# Patient Record
Sex: Male | Born: 1965 | Race: White | Hispanic: No | Smoking: Current every day smoker
Health system: Southern US, Community
[De-identification: ages and names within clinical notes are randomized; demographics above are authoritative.]

---

## 2001-01-08 ENCOUNTER — Encounter: Admission: RE | Admit: 2001-01-08 | Discharge: 2001-01-28 | Payer: Self-pay | Admitting: Family Medicine

## 2007-09-06 ENCOUNTER — Emergency Department (HOSPITAL_COMMUNITY): Admission: EM | Admit: 2007-09-06 | Discharge: 2007-09-06 | Payer: Self-pay | Admitting: Emergency Medicine

## 2008-06-19 ENCOUNTER — Emergency Department (HOSPITAL_COMMUNITY): Admission: EM | Admit: 2008-06-19 | Discharge: 2008-06-19 | Payer: Self-pay | Admitting: Emergency Medicine

## 2008-06-20 ENCOUNTER — Ambulatory Visit (HOSPITAL_COMMUNITY): Admission: RE | Admit: 2008-06-20 | Discharge: 2008-06-20 | Payer: Self-pay | Admitting: Family Medicine

## 2008-06-22 ENCOUNTER — Ambulatory Visit (HOSPITAL_COMMUNITY): Admission: RE | Admit: 2008-06-22 | Discharge: 2008-06-22 | Payer: Self-pay | Admitting: Family Medicine

## 2008-08-12 ENCOUNTER — Ambulatory Visit (HOSPITAL_COMMUNITY): Payer: Self-pay | Admitting: Oncology

## 2008-08-12 ENCOUNTER — Encounter (HOSPITAL_COMMUNITY): Admission: RE | Admit: 2008-08-12 | Discharge: 2008-09-11 | Payer: Self-pay | Admitting: Oncology

## 2008-10-24 ENCOUNTER — Ambulatory Visit (HOSPITAL_COMMUNITY): Admission: RE | Admit: 2008-10-24 | Discharge: 2008-10-24 | Payer: Self-pay | Admitting: Family Medicine

## 2009-02-28 ENCOUNTER — Emergency Department (HOSPITAL_COMMUNITY): Admission: EM | Admit: 2009-02-28 | Discharge: 2009-03-01 | Payer: Self-pay | Admitting: Emergency Medicine

## 2009-05-04 ENCOUNTER — Emergency Department (HOSPITAL_COMMUNITY): Admission: EM | Admit: 2009-05-04 | Discharge: 2009-05-04 | Payer: Self-pay | Admitting: Emergency Medicine

## 2009-05-24 ENCOUNTER — Emergency Department (HOSPITAL_COMMUNITY): Admission: EM | Admit: 2009-05-24 | Discharge: 2009-05-24 | Payer: Self-pay | Admitting: Emergency Medicine

## 2009-07-19 ENCOUNTER — Emergency Department (HOSPITAL_COMMUNITY): Admission: EM | Admit: 2009-07-19 | Discharge: 2009-07-19 | Payer: Self-pay | Admitting: Emergency Medicine

## 2009-07-21 ENCOUNTER — Emergency Department (HOSPITAL_COMMUNITY): Admission: EM | Admit: 2009-07-21 | Discharge: 2009-07-21 | Payer: Self-pay | Admitting: Emergency Medicine

## 2009-08-01 ENCOUNTER — Emergency Department (HOSPITAL_COMMUNITY): Admission: EM | Admit: 2009-08-01 | Discharge: 2009-08-01 | Payer: Self-pay | Admitting: Emergency Medicine

## 2010-08-03 ENCOUNTER — Emergency Department (HOSPITAL_COMMUNITY)
Admission: EM | Admit: 2010-08-03 | Discharge: 2010-08-03 | Payer: Self-pay | Source: Home / Self Care | Admitting: Emergency Medicine

## 2010-08-26 ENCOUNTER — Encounter: Payer: Self-pay | Admitting: Neurosurgery

## 2010-09-05 ENCOUNTER — Emergency Department (HOSPITAL_COMMUNITY)
Admission: EM | Admit: 2010-09-05 | Discharge: 2010-09-05 | Disposition: A | Discharge status: Home / Self Care | Payer: Self-pay | Attending: Emergency Medicine | Admitting: Emergency Medicine

## 2010-09-05 DIAGNOSIS — K029 Dental caries, unspecified: Secondary | ICD-10-CM | POA: Insufficient documentation

## 2010-09-05 DIAGNOSIS — K089 Disorder of teeth and supporting structures, unspecified: Secondary | ICD-10-CM | POA: Insufficient documentation

## 2010-10-11 ENCOUNTER — Emergency Department (HOSPITAL_COMMUNITY): Payer: Self-pay

## 2010-10-11 ENCOUNTER — Emergency Department (HOSPITAL_COMMUNITY)
Admission: EM | Admit: 2010-10-11 | Discharge: 2010-10-11 | Disposition: A | Discharge status: Home / Self Care | Payer: Self-pay | Attending: Emergency Medicine | Admitting: Emergency Medicine

## 2010-10-11 ENCOUNTER — Encounter (HOSPITAL_COMMUNITY): Payer: Self-pay | Admitting: Radiology

## 2010-10-11 DIAGNOSIS — R51 Headache: Secondary | ICD-10-CM | POA: Insufficient documentation

## 2010-10-11 DIAGNOSIS — J029 Acute pharyngitis, unspecified: Secondary | ICD-10-CM | POA: Insufficient documentation

## 2010-10-11 LAB — BASIC METABOLIC PANEL
Calcium: 9.9 mg/dL (ref 8.4–10.5)
Chloride: 104 mEq/L (ref 96–112)
GFR calc Af Amer: >60 mL/min (ref >60)
GFR calc non Af Amer: >60 mL/min (ref >60)
Potassium: 4.1 mEq/L (ref 3.5–5.1)

## 2010-10-11 LAB — RAPID STREP SCREEN (MED CTR MEBANE ONLY): Streptococcus, Group A Screen (Direct): NEGATIVE

## 2010-10-11 MED ORDER — IOHEXOL 300 MG/ML  SOLN
75.0000 mL | Freq: Once | INTRAMUSCULAR | Status: AC | PRN
  Administered 2010-10-11: 75 mL via INTRAVENOUS

## 2010-11-19 LAB — CBC
HCT: 49.3 % (ref 39.0–52.0)
Hemoglobin: 16.8 g/dL (ref 13.0–17.0)
Platelets: 222 10*3/uL (ref 150–400)
RBC: 4.62 MIL/uL (ref 4.22–5.81)
WBC: 6.2 10*3/uL (ref 4.0–10.5)

## 2010-11-19 LAB — COMPREHENSIVE METABOLIC PANEL
Albumin: 4.4 g/dL (ref 3.5–5.2)
CO2: 31 mEq/L (ref 19–32)
Calcium: 9.7 mg/dL (ref 8.4–10.5)
GFR calc non Af Amer: >60 mL/min (ref >60)
Glucose, Bld: 118 mg/dL — ABNORMAL HIGH (ref 70–99)
Total Protein: 7.3 g/dL (ref 6.0–8.3)

## 2010-11-19 LAB — DIFFERENTIAL
Eosinophils Absolute: 0.1 10*3/uL (ref 0.0–0.7)
Eosinophils Relative: 2 % (ref 0–5)
Lymphocytes Relative: 40 % (ref 12–46)
Lymphs Abs: 2.5 10*3/uL (ref 0.7–4.0)
Monocytes Relative: 7 % (ref 3–12)
Neutrophils Relative %: 51 % (ref 43–77)

## 2010-11-19 LAB — RETICULOCYTES
RBC.: 4.63 MIL/uL (ref 4.22–5.81)
Retic Count, Absolute: 37 10*3/uL (ref 19.0–186.0)
Retic Ct Pct: 0.8 % (ref 0.4–3.1)

## 2010-11-19 LAB — FOLATE: Folate: >20 ng/mL

## 2010-11-19 LAB — VITAMIN B12: Vitamin B-12: 678 pg/mL (ref 211–911)

## 2010-11-19 LAB — LIPID PANEL
Cholesterol: 229 mg/dL — ABNORMAL HIGH (ref 0–200)
LDL Cholesterol: 138 mg/dL — ABNORMAL HIGH (ref 0–99)
Triglycerides: 326 mg/dL — ABNORMAL HIGH (ref <150)

## 2010-12-23 ENCOUNTER — Emergency Department (HOSPITAL_COMMUNITY)
Admission: EM | Admit: 2010-12-23 | Discharge: 2010-12-23 | Disposition: A | Discharge status: Home / Self Care | Payer: Self-pay | Attending: Emergency Medicine | Admitting: Emergency Medicine

## 2010-12-23 DIAGNOSIS — K029 Dental caries, unspecified: Secondary | ICD-10-CM | POA: Insufficient documentation

## 2010-12-23 DIAGNOSIS — K089 Disorder of teeth and supporting structures, unspecified: Secondary | ICD-10-CM | POA: Insufficient documentation

## 2011-04-09 ENCOUNTER — Other Ambulatory Visit (HOSPITAL_COMMUNITY): Payer: Self-pay | Admitting: Family Medicine

## 2011-04-09 DIAGNOSIS — R200 Anesthesia of skin: Secondary | ICD-10-CM

## 2011-04-09 DIAGNOSIS — M549 Dorsalgia, unspecified: Secondary | ICD-10-CM

## 2011-04-15 ENCOUNTER — Inpatient Hospital Stay (HOSPITAL_COMMUNITY): Admission: RE | Admit: 2011-04-15 | Payer: Self-pay | Source: Ambulatory Visit

## 2011-04-24 ENCOUNTER — Other Ambulatory Visit (HOSPITAL_COMMUNITY): Payer: Self-pay | Admitting: Emergency Medicine

## 2011-04-24 DIAGNOSIS — M48061 Spinal stenosis, lumbar region without neurogenic claudication: Secondary | ICD-10-CM

## 2011-04-29 ENCOUNTER — Ambulatory Visit (HOSPITAL_COMMUNITY)
Admission: RE | Admit: 2011-04-29 | Discharge: 2011-04-29 | Disposition: A | Discharge status: Home / Self Care | Payer: BC Managed Care – PPO | Source: Ambulatory Visit | Attending: Orthopedic Surgery | Admitting: Orthopedic Surgery

## 2011-04-29 DIAGNOSIS — M5126 Other intervertebral disc displacement, lumbar region: Secondary | ICD-10-CM | POA: Insufficient documentation

## 2011-04-29 DIAGNOSIS — M48061 Spinal stenosis, lumbar region without neurogenic claudication: Secondary | ICD-10-CM

## 2011-04-29 DIAGNOSIS — M545 Low back pain, unspecified: Secondary | ICD-10-CM | POA: Insufficient documentation

## 2011-09-07 ENCOUNTER — Emergency Department (HOSPITAL_COMMUNITY)
Admission: EM | Admit: 2011-09-07 | Discharge: 2011-09-07 | Disposition: A | Discharge status: Home / Self Care | Payer: BC Managed Care – PPO | Attending: Emergency Medicine | Admitting: Emergency Medicine

## 2011-09-07 ENCOUNTER — Encounter (HOSPITAL_COMMUNITY): Payer: Self-pay | Admitting: *Deleted

## 2011-09-07 DIAGNOSIS — L723 Sebaceous cyst: Secondary | ICD-10-CM

## 2011-09-07 DIAGNOSIS — F17200 Nicotine dependence, unspecified, uncomplicated: Secondary | ICD-10-CM | POA: Insufficient documentation

## 2011-09-07 MED ORDER — HYDROCODONE-ACETAMINOPHEN 5-325 MG PO TABS: ORAL_TABLET | ORAL | Status: AC

## 2011-09-07 MED ORDER — OXYCODONE-ACETAMINOPHEN 5-325 MG PO TABS
1.0000 | ORAL_TABLET | Freq: Once | ORAL | Status: AC
  Administered 2011-09-07: 1 via ORAL
  Filled 2011-09-07: qty 1

## 2011-09-07 MED ORDER — DOXYCYCLINE HYCLATE 100 MG PO CAPS: 100.0000 mg | ORAL_CAPSULE | Freq: Two times a day (BID) | ORAL | Status: AC

## 2011-09-07 MED ORDER — LIDOCAINE HCL (PF) 1 % IJ SOLN
5.0000 mL | Freq: Once | INTRAMUSCULAR | Status: AC
  Administered 2011-09-07: 5 mL
  Filled 2011-09-07: qty 5

## 2011-09-07 NOTE — ED Notes (Signed)
Cyst to right ear. Has had same before and lanced but recently returned.

## 2011-09-07 NOTE — ED Provider Notes (Signed)
History     CSN: [id number]  Arrival date & time 09/07/11  1402   First MD Initiated Contact with Patient 09/07/11 1412      Chief Complaint  Patient presents with  . Abscess    (Consider location/radiation/quality/duration/timing/severity/associated sxs/prior treatment) Patient is a 46 y.o. male presenting with abscess. The history is provided by the patient. No language interpreter was used.  Abscess  This is a recurrent problem. The current episode started less than one week ago. The onset was gradual. The problem occurs occasionally. The problem has been gradually worsening. The abscess is present on the right ear. The problem is mild. The abscess is characterized by swelling and painfulness. It is unknown what he was exposed to. Pertinent negatives include no decrease in physical activity, no fever, no congestion, no rhinorrhea and no sore throat. His past medical history is significant for skin abscesses in family. There were no sick contacts. He has received no recent medical care.    History reviewed. No pertinent past medical history.  History reviewed. No pertinent past surgical history.  No family history on file.  History  Substance Use Topics  . Smoking status: Current Everyday Smoker  . Smokeless tobacco: Not on file  . Alcohol Use: No      Review of Systems  Constitutional: Negative for fever and chills.  HENT: Positive for ear pain. Negative for congestion, sore throat, rhinorrhea, trouble swallowing, neck pain and neck stiffness.   Respiratory: Negative for shortness of breath.   Cardiovascular: Negative for chest pain and palpitations.  Musculoskeletal: Negative for myalgias and arthralgias.  Skin: Negative for rash.       abscess  Neurological: Negative for dizziness, weakness and numbness.  Hematological: Does not bruise/bleed easily.  All other systems reviewed and are negative.    Allergies  Aspirin and Ibuprofen  Home Medications   Current  Outpatient Rx  Name Route Sig Dispense Refill  . DOXYCYCLINE HYCLATE 100 MG PO CAPS Oral Take 1 capsule (100 mg total) by mouth 2 (two) times daily. For 10 days 20 capsule 0  . HYDROCODONE-ACETAMINOPHEN 5-325 MG PO TABS  Take one-two tabs po q 4-6 hrs prn pain 20 tablet 0    BP 127/107  Pulse 80  Temp(Src) 98 F (36.7 C) (Oral)  Resp 20  Ht 5\' 11"  (1.803 m)  Wt 195 lb (88.451 kg)  BMI 27.20 kg/m2  SpO2 99%  Physical Exam  Nursing note and vitals reviewed. Constitutional: He is oriented to person, place, and time. He appears well-developed and well-nourished. No distress.  HENT:  Head: Normocephalic and atraumatic.  Right Ear: There is swelling and tenderness. No mastoid tenderness. Tympanic membrane is not erythematous. No hemotympanum.  Left Ear: Tympanic membrane, external ear and ear canal normal.  Ears:  Mouth/Throat: Uvula is midline, oropharynx is clear and moist and mucous membranes are normal.       Abscess to right earlobe  Neck: Normal range of motion. Neck supple.  Cardiovascular: Normal rate, regular rhythm and normal heart sounds.   Pulmonary/Chest: Effort normal and breath sounds normal. No respiratory distress.  Musculoskeletal: Normal range of motion. He exhibits no tenderness.  Lymphadenopathy:    He has no cervical adenopathy.  Neurological: He is alert and oriented to person, place, and time. No cranial nerve deficit. He exhibits normal muscle tone. Coordination normal.  Skin: Skin is warm and dry.    ED Course  Procedures (including critical care time) INCISION AND DRAINAGE Performed by: Pauline Aus  L. Consent: Verbal consent obtained. Risks and benefits: risks, benefits and alternatives were discussed Type: abscess  Body area: base of right earlobe  Anesthesia: local infiltration  Local anesthetic: lidocaine 1% w/o epinephrine  Anesthetic total: 1 ml  Complexity: complex Blunt dissection to break up loculations  Drainage:  purulent  Drainage amount: moderate  Packing material: 1/4 in iodoform gauze  Patient tolerance: Patient tolerated the procedure well with no immediate complications.     1. Sebaceous cyst       MDM   Pt with hx of recurrent sebaceous cyst.  Pt agrees to f/u with a dermatologist.  Pain improved after ed course.   Patient / Family / Caregiver understand and agree with initial ED impression and plan with expectations set for ED visit.     Mirranda Monrroy L. [place], [place] 09/09/11 2042

## 2011-09-07 NOTE — ED Notes (Signed)
Pt a/ox4. Resp even and unlabored. NAD at this time. D/C instructions reviewed with pt. Pt verbalized understanding. Pt ambulated to lobby with steady gate.  

## 2011-09-07 NOTE — ED Notes (Signed)
Pt presented to the ED with pain in the right ear. Pt states that he noticed pain in his ear yesterday and states this is not the 1st time he has had this problem. Pt notes he had the same issue a "couple of years ago". Pt states the pain is a 8 on scale of 0-10. Swelling and redness noted on the right ear with some pain during palpitation. Pt noted no pain in sinus region but indicated it feels as if he is having "tooth" pain. Pt resp normal and unlabored. Pt A&O X 4. Pt ambulated to the Ed.

## 2011-09-10 NOTE — ED Provider Notes (Signed)
Medical screening examination/treatment/procedure(s) were performed by non-physician practitioner and as supervising physician I was immediately available for consultation/collaboration. Devoria Albe, MD, FACEP   Ward Givens, MD 09/10/11 [phone]

## 2011-10-23 ENCOUNTER — Emergency Department (HOSPITAL_COMMUNITY)
Admission: EM | Admit: 2011-10-23 | Discharge: 2011-10-23 | Disposition: A | Discharge status: Home / Self Care | Payer: BC Managed Care – PPO | Attending: Emergency Medicine | Admitting: Emergency Medicine

## 2011-10-23 ENCOUNTER — Encounter (HOSPITAL_COMMUNITY): Payer: Self-pay

## 2011-10-23 DIAGNOSIS — J339 Nasal polyp, unspecified: Secondary | ICD-10-CM

## 2011-10-23 DIAGNOSIS — F17200 Nicotine dependence, unspecified, uncomplicated: Secondary | ICD-10-CM | POA: Insufficient documentation

## 2011-10-23 DIAGNOSIS — J029 Acute pharyngitis, unspecified: Secondary | ICD-10-CM | POA: Insufficient documentation

## 2011-10-23 DIAGNOSIS — J3489 Other specified disorders of nose and nasal sinuses: Secondary | ICD-10-CM | POA: Insufficient documentation

## 2011-10-23 NOTE — ED Notes (Signed)
Pt says was diagnosed with a cyst in his throat.  Says feels like cyst in behind his mouth inbetween his nose and mouth.  Says at times it flares up and hurts.  Pt denies difficulty breathing but says is painful.

## 2011-10-26 NOTE — ED Provider Notes (Signed)
History     CSN: [id number]  Arrival date & time 10/23/11  1245   First MD Initiated Contact with Patient 10/23/11 1600      Chief Complaint  Patient presents with  . Sore Throat    (Consider location/radiation/quality/duration/timing/severity/associated sxs/prior treatment) HPI Comments: Patient comes to the emergency department requesting a referral to ENT. He states that he has a cyst in his sinuses. States that he had any CT scan last year that confirmed this. He complains of intermittent pain to the soft palate area of his mouth. He also reports occasional nasal congestion. He denies difficulty swallowing, fever, or neck pain.  Patient is a 46 y.o. male presenting with pharyngitis. The history is provided by the patient. No language interpreter was used.  Sore Throat This is a chronic problem. The current episode started more than 1 month ago. The problem occurs constantly. The problem has been gradually worsening. Associated symptoms include congestion and a sore throat. Pertinent negatives include no coughing, fever, headaches, neck pain, swollen glands, vertigo or visual change. Associated symptoms comments: Nasal congestion. The symptoms are aggravated by nothing. He has tried nothing for the symptoms. The treatment provided no relief.    History reviewed. No pertinent past medical history.  History reviewed. No pertinent past surgical history.  No family history on file.  History  Substance Use Topics  . Smoking status: Current Everyday Smoker  . Smokeless tobacco: Not on file  . Alcohol Use: No      Review of Systems  Constitutional: Negative for fever and appetite change.  HENT: Positive for congestion and sore throat. Negative for facial swelling, rhinorrhea, trouble swallowing, neck pain, neck stiffness, dental problem, sinus pressure and ear discharge.   Respiratory: Negative for cough and shortness of breath.   Neurological: Negative for vertigo and headaches.   All other systems reviewed and are negative.    Allergies  Aspirin and Ibuprofen  Home Medications   Current Outpatient Rx  Name Route Sig Dispense Refill  . GUAIFENESIN 100 MG/5ML PO SYRP Oral Take 200 mg by mouth as needed. For cold symptoms      BP 141/97  Pulse 94  Temp(Src) 98.1 F (36.7 C) (Oral)  Resp 18  Ht 5\' 11"  (1.803 m)  Wt 200 lb (90.719 kg)  BMI 27.89 kg/m2  SpO2 97%  Physical Exam  Nursing note and vitals reviewed. Constitutional: He appears well-developed. No distress.  HENT:  Head: Normocephalic and atraumatic. No trismus in the jaw.  Right Ear: Tympanic membrane and ear canal normal.  Left Ear: Tympanic membrane and ear canal normal.  Nose: Mucosal edema present. No rhinorrhea or nasal septal hematoma. No epistaxis.  No foreign bodies. Right sinus exhibits no maxillary sinus tenderness and no frontal sinus tenderness. Left sinus exhibits no maxillary sinus tenderness and no frontal sinus tenderness.  Mouth/Throat: Uvula is midline, oropharynx is clear and moist and mucous membranes are normal. No oral lesions.  Eyes: EOM are normal. Pupils are equal, round, and reactive to light.  Cardiovascular: Normal rate and normal heart sounds.   Pulmonary/Chest: Breath sounds normal. No respiratory distress.  Neurological: He is alert. He exhibits normal muscle tone. Coordination normal.  Skin: Skin is warm and dry.    ED Course  Procedures (including critical care time)    1. Nasal polyp       MDM    Have reviewed patient's previous ED charts and nursing notes. Patient had a CT  maxillofacial March of 2012 that  showed a mucosal retention cyst of the right maxillary sinus.   Patient is alert vitals are stable, he is nontoxic appearing. Airway is patent. No edema.  I will refer the patient to ENT for further management.        Morrison Masser L. [place], [place] 10/26/11 1746

## 2011-10-27 NOTE — ED Provider Notes (Signed)
Medical screening examination/treatment/procedure(s) were performed by non-physician practitioner and as supervising physician I was immediately available for consultation/collaboration.  Flint Melter, MD 10/27/11 1126

## 2012-05-25 ENCOUNTER — Encounter (HOSPITAL_COMMUNITY): Payer: Self-pay | Admitting: *Deleted

## 2012-05-25 ENCOUNTER — Emergency Department (HOSPITAL_COMMUNITY)
Admission: EM | Admit: 2012-05-25 | Discharge: 2012-05-25 | Disposition: A | Discharge status: Home / Self Care | Payer: BC Managed Care – PPO | Attending: Emergency Medicine | Admitting: Emergency Medicine

## 2012-05-25 DIAGNOSIS — F17200 Nicotine dependence, unspecified, uncomplicated: Secondary | ICD-10-CM | POA: Insufficient documentation

## 2012-05-25 DIAGNOSIS — L089 Local infection of the skin and subcutaneous tissue, unspecified: Secondary | ICD-10-CM

## 2012-05-25 DIAGNOSIS — L723 Sebaceous cyst: Secondary | ICD-10-CM | POA: Insufficient documentation

## 2012-05-25 DIAGNOSIS — L729 Follicular cyst of the skin and subcutaneous tissue, unspecified: Secondary | ICD-10-CM

## 2012-05-25 MED ORDER — HYDROCODONE-ACETAMINOPHEN 7.5-325 MG PO TABS: 1.0000 | ORAL_TABLET | ORAL | Status: DC | PRN

## 2012-05-25 MED ORDER — AMOXICILLIN-POT CLAVULANATE 875-125 MG PO TABS: 1.0000 | ORAL_TABLET | Freq: Two times a day (BID) | ORAL | Status: DC

## 2012-05-25 NOTE — ED Provider Notes (Signed)
Medical screening examination/treatment/procedure(s) were performed by non-physician practitioner and as supervising physician I was immediately available for consultation/collaboration. Devoria Albe, MD, [facility]   Ward Givens, MD 05/25/12 1450

## 2012-05-25 NOTE — ED Notes (Signed)
H. Bryant, PA at bedside. 

## 2012-05-25 NOTE — ED Notes (Signed)
Pt with redness and swelling to right earlobe, c/o pain to area and to right jaw, reoccurred a month ago, hx of same one at same spot and was lanced per pt 2 years ago, states that it has a smell at times but denies any drainage, cyst is soft

## 2012-05-25 NOTE — ED Notes (Addendum)
Swelling, redness of rt ear. Says he  Had  A cyst removed from this ear 2 years ago "and it came back"

## 2012-05-25 NOTE — ED Provider Notes (Signed)
History     CSN: [id number]  Arrival date & time 05/25/12  1253   None     Chief Complaint  Patient presents with  . Abscess    (Consider location/radiation/quality/duration/timing/severity/associated sxs/prior treatment) Patient is a 46 y.o. male presenting with abscess. The history is provided by the patient.  Abscess  This is a new problem. The problem has been gradually worsening. The abscess is present on the right ear. The problem is moderate. The abscess is characterized by painfulness. It is unknown what he was exposed to. Pertinent negatives include no fever, no vomiting and no cough. His past medical history is significant for skin abscesses in family. There were no sick contacts. He has received no recent medical care.    History reviewed. No pertinent past medical history.  History reviewed. No pertinent past surgical history.  History reviewed. No pertinent family history.  History  Substance Use Topics  . Smoking status: Current Every Day Smoker  . Smokeless tobacco: Not on file  . Alcohol Use: No      Review of Systems  Constitutional: Negative for fever and activity change.       All ROS Neg except as noted in HPI  HENT: Negative for nosebleeds and neck pain.   Eyes: Negative for photophobia and discharge.  Respiratory: Negative for cough, shortness of breath and wheezing.   Cardiovascular: Negative for chest pain and palpitations.  Gastrointestinal: Negative for vomiting, abdominal pain and blood in stool.  Genitourinary: Negative for dysuria, frequency and hematuria.  Musculoskeletal: Negative for back pain and arthralgias.  Skin: Negative.   Neurological: Negative for dizziness, seizures and speech difficulty.  Psychiatric/Behavioral: Negative for hallucinations and confusion.    Allergies  Aspirin and Ibuprofen  Home Medications   Current Outpatient Rx  Name Route Sig Dispense Refill  . GUAIFENESIN 100 MG/5ML PO SYRP Oral Take 200 mg by  mouth as needed. For cold symptoms      BP 135/93  Pulse 97  Temp 98.1 F (36.7 C) (Oral)  Resp 20  Ht 5\' 11"  (1.803 m)  Wt 190 lb (86.183 kg)  BMI 26.50 kg/m2  SpO2 98%  Physical Exam  Nursing note and vitals reviewed. Constitutional: He is oriented to person, place, and time. He appears well-developed and well-nourished.  Non-toxic appearance.  HENT:  Head: Normocephalic.  Right Ear: Tympanic membrane and external ear normal.  Left Ear: Tympanic membrane and external ear normal.       The pain in the remainder of the external ear is somewhat red, but not hot. There is a soft tissue cyst at the base of the earlobe. This is tender to palpation. It is not hot. And there no red streaks noted. Is there is no pain or swelling or redness behind the ear.  Eyes: EOM and lids are normal. Pupils are equal, round, and reactive to light.  Neck: Normal range of motion. Neck supple. Carotid bruit is not present.  Cardiovascular: Normal rate, regular rhythm, normal heart sounds, intact distal pulses and normal pulses.   Pulmonary/Chest: Breath sounds normal. No respiratory distress.  Abdominal: Soft. Bowel sounds are normal. There is no tenderness. There is no guarding.  Musculoskeletal: Normal range of motion.  Lymphadenopathy:       Head (right side): No submandibular adenopathy present.       Head (left side): No submandibular adenopathy present.    He has no cervical adenopathy.  Neurological: He is alert and oriented to person, place, and time.  He has normal strength. No cranial nerve deficit or sensory deficit.  Skin: Skin is warm and dry.  Psychiatric: He has a normal mood and affect. His speech is normal.    ED Course  Procedures (including critical care time)  Labs Reviewed - No data to display No results found.   No diagnosis found.    MDM  I have reviewed nursing notes, vital signs, and all appropriate lab and imaging results for this patient. Patient has a fatty cyst at  the base of the right earlobe. Patient has had an incision and drainage in the past at which time was found this was a sebaceous cyst. The patient states that he got better and it has now come back again. The plan at this time is for the patient to see the general surgeon for surgical intervention. We'll cover the patient with antibiotic therapy including Augmentin 1 tablet 2 times daily. Norco every 4 hours as needed for pain.       Kathie Dike, [place] 05/25/12 1322

## 2021-03-18 ENCOUNTER — Encounter (HOSPITAL_COMMUNITY): Payer: Self-pay

## 2021-03-18 ENCOUNTER — Other Ambulatory Visit: Payer: Self-pay

## 2021-03-18 ENCOUNTER — Emergency Department (HOSPITAL_COMMUNITY): Payer: Self-pay

## 2021-03-18 ENCOUNTER — Emergency Department (HOSPITAL_COMMUNITY)
Admission: EM | Admit: 2021-03-18 | Discharge: 2021-03-18 | Disposition: A | Discharge status: Home / Self Care | Payer: Self-pay | Attending: Emergency Medicine | Admitting: Emergency Medicine

## 2021-03-18 DIAGNOSIS — R609 Edema, unspecified: Secondary | ICD-10-CM

## 2021-03-18 DIAGNOSIS — L03113 Cellulitis of right upper limb: Secondary | ICD-10-CM | POA: Insufficient documentation

## 2021-03-18 DIAGNOSIS — F17200 Nicotine dependence, unspecified, uncomplicated: Secondary | ICD-10-CM | POA: Insufficient documentation

## 2021-03-18 MED ORDER — DOXYCYCLINE HYCLATE 100 MG PO CAPS: 100.0000 mg | ORAL_CAPSULE | Freq: Two times a day (BID) | ORAL | 0 refills | Status: AC

## 2021-03-18 NOTE — ED Provider Notes (Signed)
[facility] EMERGENCY DEPARTMENT Provider Note   CSN: [id number] Arrival date & time: 03/18/21  1802     History Chief Complaint  Patient presents with   Arm Swelling    Willie Kaiser is a 55 y.o. male.  Mr. Willie Kaiser is a 55 y/o male who presents today with arm redness and swelling for 3 days. Patient states he was trimming bushes and noticed a large splinter in the skin of his right elbow. He used his pocket knife to dig it out and since has experienced right forearm swelling, redness, and mild pain. His sister is a Research scientist (physical sciences) and gave him 5 tablets of doxycycline 100mg  to take. He has not tried anything else. Patient reports no other medical problems, takes no medications, and reports no allergies. Denies chest pain, difficulty breathing, nausea, vomiting, or abdominal pain.   The history is provided by the patient.  Arm Injury Location:  Arm Arm location:  R forearm Injury: yes   Mechanism of injury comment:  Splinter from yardwork Pain details:    Severity:  Mild   Onset quality:  Gradual   Progression:  Unchanged Foreign body present:  No foreign bodies Ineffective treatments: Took 5 Doxycycline 100mg  tablets. Associated symptoms: swelling   Associated symptoms: no fever, no numbness, no stiffness and no tingling        History reviewed. No pertinent past medical history.  There are no problems to display for this patient.   History reviewed. No pertinent surgical history.     History reviewed. No pertinent family history.  Social History   Tobacco Use   Smoking status: Every Day  Substance Use Topics   Alcohol use: No   Drug use: No    Home Medications Prior to Admission medications   Medication Sig Start Date End Date Taking? Authorizing Provider  doxycycline (VIBRAMYCIN) 100 MG capsule Take 1 capsule (100 mg total) by mouth 2 (two) times daily for 7 days. 03/18/21 03/25/21 Yes Trysten Berti T, PA-C  amoxicillin-clavulanate  (AUGMENTIN) 875-125 MG per tablet Take 1 tablet by mouth 2 (two) times daily. 05/25/12   03/27/21, PA-C  HYDROcodone-acetaminophen (NORCO) 7.5-325 MG per tablet Take 1 tablet by mouth every 4 (four) hours as needed for pain. 05/25/12   12-03-1976, PA-C  Multiple Vitamin (MULTIVITAMIN WITH MINERALS) TABS Take 1 tablet by mouth daily.    [provider]    Allergies    Aspirin and Ibuprofen  Review of Systems   Review of Systems  Constitutional:  Negative for chills and fever.  HENT:  Negative for sore throat.   Respiratory:  Negative for cough, chest tightness and shortness of breath.   Cardiovascular:  Negative for chest pain.  Gastrointestinal:  Negative for abdominal pain and vomiting.  Musculoskeletal:  Negative for arthralgias and stiffness.  Skin:  Positive for color change. Negative for rash.       Redness of right forearm  All other systems reviewed and are negative.  Physical Exam Updated Vital Signs BP 139/85   Pulse 89   Temp 98.3 F (36.8 C) (Oral)   Resp 18   Ht 5\' 11"  (1.803 m)   Wt 91.8 kg   SpO2 99%   BMI 28.22 kg/m   Physical Exam Vitals and nursing note reviewed.  Constitutional:      Appearance: Normal appearance.  HENT:     Head: Normocephalic and atraumatic.  Eyes:     Conjunctiva/sclera: Conjunctivae normal.  Cardiovascular:  Rate and Rhythm: Normal rate and regular rhythm.     Pulses: Normal pulses.  Pulmonary:     Effort: Pulmonary effort is normal.     Breath sounds: Normal breath sounds.  Musculoskeletal:        General: Normal range of motion.     Right upper arm: Normal.     Right elbow: Normal range of motion.     Right forearm: Swelling and tenderness present.     Right wrist: Normal.     Comments: Normal range of motion of right elbow and right wrist without pain. Mild swelling with some tenderness to palpation.  Skin:    General: Skin is warm and dry.     Comments: Diffuse area of redness on right forearm. No  distinct borders, no areas of fluctuance. No foreign body visualized. Photo included in chart.  Neurological:     Mental Status: He is alert.         ED Results / Procedures / Treatments   Labs (all labs ordered are listed, but only abnormal results are displayed) Labs Reviewed - No data to display  EKG None  Radiology DG Forearm Right  Result Date: 03/18/2021 CLINICAL DATA:  Swelling and redness to RIGHT forearm. Recent splinter to forearm. EXAM: RIGHT FOREARM - 2 VIEW COMPARISON:  None. FINDINGS: Osseous alignment is normal. Bone mineralization is normal. No acute-appearing cortical irregularity or osseous lesion. Soft tissues about the RIGHT forearm are unremarkable. No radiodense foreign body seen. No soft tissue gas. IMPRESSION: Negative. No osseous abnormality. No soft tissue gas or radiodense foreign body seen. Electronically Signed   By: Bary Richard M.D.   On: 03/18/2021 19:51    Procedures Procedures   Medications Ordered in ED Medications - No data to display  ED Course  I have reviewed the triage vital signs and the nursing notes.  Pertinent labs & imaging results that were available during my care of the patient were reviewed by me and considered in my medical decision making (see chart for details).    MDM Rules/Calculators/A&P                           Patient is 55 y/o male who presents with arm swelling. Patient was doing yardwork on Thursday and noticed a large splinter in his right arm, approximately 2 cm distal from his olecranon process. Patient used his pocket knife to dig out the splinter and woke up the next morning with red and swollen right forearm. Took 5 tablets of Doxycycline 100mg  given to him by his sister. Has no other complaints at this time. He denies systemic symptoms including chest pain, shortness of breath, nausea, vomiting, or abdominal pain. Patient has no medical problems and takes no medications, no allergies.   On physical exam  patient's right arm is erythematous and swollen. Pulses equal in bilateral upper extremities. Normal range of motion without pain in right elbow and right wrist. No areas of fluctuance suspicious for abscess. Patient overall appears well. XR of right elbow shows no retained foreign bodies. I do not think patient requires admission and inpatient treatment for his symptoms at this time. He would benefit from an outpatient course of Doxycycline 100mg  BID for 7 days. He can take OTC anti-inflammatories for pain. Patient agreeable to plan.   Final Clinical Impression(s) / ED Diagnoses Final diagnoses:  Cellulitis of right upper extremity    Rx / DC Orders ED Discharge Orders  Ordered    doxycycline (VIBRAMYCIN) 100 MG capsule  2 times daily        03/18/21 1949             Jeanella Flattery 03/18/21 2006    Eber Hong, MD 03/19/21 [phone]

## 2021-03-18 NOTE — Discharge Instructions (Addendum)
You likely have cellulitis, which is a skin infection. Your x-ray did not show any retained foreign bodies that would require removal. You are being prescribed Doxycycline, which you are to take two tablets daily for 7 days. It is important you finish the entire course of antibiotics. Return to the ED for worsening symptoms.

## 2021-03-18 NOTE — ED Triage Notes (Signed)
Pts. Right arm is swollen and red. Pt. States they were trimming bushes and trees yesterday.

## 2021-06-14 ENCOUNTER — Emergency Department (HOSPITAL_COMMUNITY)
Admission: EM | Admit: 2021-06-14 | Discharge: 2021-06-14 | Disposition: A | Discharge status: Home / Self Care | Payer: Self-pay | Attending: Emergency Medicine | Admitting: Emergency Medicine

## 2021-06-14 ENCOUNTER — Encounter (HOSPITAL_COMMUNITY): Payer: Self-pay | Admitting: *Deleted

## 2021-06-14 DIAGNOSIS — Z4801 Encounter for change or removal of surgical wound dressing: Secondary | ICD-10-CM | POA: Insufficient documentation

## 2021-06-14 DIAGNOSIS — R059 Cough, unspecified: Secondary | ICD-10-CM | POA: Insufficient documentation

## 2021-06-14 DIAGNOSIS — F17200 Nicotine dependence, unspecified, uncomplicated: Secondary | ICD-10-CM | POA: Insufficient documentation

## 2021-06-14 DIAGNOSIS — R21 Rash and other nonspecific skin eruption: Secondary | ICD-10-CM | POA: Insufficient documentation

## 2021-06-14 DIAGNOSIS — R0682 Tachypnea, not elsewhere classified: Secondary | ICD-10-CM | POA: Insufficient documentation

## 2021-06-14 MED ORDER — DOXYCYCLINE HYCLATE 100 MG PO CAPS: 100.0000 mg | ORAL_CAPSULE | Freq: Two times a day (BID) | ORAL | 0 refills | Status: AC

## 2021-06-14 NOTE — ED Provider Notes (Signed)
[facility] EMERGENCY DEPARTMENT Provider Note   CSN: [id number] Arrival date & time: 06/14/21  1533     History Chief Complaint  Patient presents with   Cough   Wound Check    Willie Kaiser is a 55 y.o. male.   Cough Wound Check    Pt states he is not coughing -   He has a rash on his R forearm - ongoing for a week - multiple lesions from where he nicks his skin when he is working - each with a small red halo around them, no pain, no itching, no fever, no meds for this prior to arrival - he is not having any f/c/n/v and no other sx.  States his heart rate is fast b/c he doesn't like being in the hospital.  Sx are mild and gradually wosrening  History reviewed. No pertinent past medical history.  There are no problems to display for this patient.   History reviewed. No pertinent surgical history.     No family history on file.  Social History   Tobacco Use   Smoking status: Every Day  Substance Use Topics   Alcohol use: No   Drug use: No    Home Medications Prior to Admission medications   Medication Sig Start Date End Date Taking? Authorizing Provider  doxycycline (VIBRAMYCIN) 100 MG capsule Take 1 capsule (100 mg total) by mouth 2 (two) times daily. 06/14/21  Yes Eber Hong, MD  Multiple Vitamin (MULTIVITAMIN WITH MINERALS) TABS Take 1 tablet by mouth daily.    [provider]    Allergies    Aspirin and Ibuprofen  Review of Systems   Review of Systems  Respiratory:  Positive for cough.   All other systems reviewed and are negative.  Physical Exam Updated Vital Signs BP (!) 170/101 (BP Location: Left Arm)   Pulse (!) 115   Temp 97.7 F (36.5 C) (Oral)   Resp 18   SpO2 99%   Physical Exam Vitals and nursing note reviewed.  Constitutional:      General: He is not in acute distress.    Appearance: He is well-developed.  HENT:     Head: Normocephalic and atraumatic.     Mouth/Throat:     Pharynx: No oropharyngeal exudate.   Eyes:     General: No scleral icterus.       Right eye: No discharge.        Left eye: No discharge.     Conjunctiva/sclera: Conjunctivae normal.     Pupils: Pupils are equal, round, and reactive to light.  Neck:     Thyroid: No thyromegaly.     Vascular: No JVD.  Cardiovascular:     Rate and Rhythm: Regular rhythm. Tachycardia present.     Heart sounds: Normal heart sounds. No murmur heard.   No friction rub. No gallop.  Pulmonary:     Effort: Pulmonary effort is normal. No respiratory distress.     Breath sounds: Normal breath sounds. No wheezing or rales.  Abdominal:     General: Bowel sounds are normal. There is no distension.     Palpations: Abdomen is soft. There is no mass.     Tenderness: There is no abdominal tenderness.  Musculoskeletal:        General: No tenderness. Normal range of motion.     Cervical back: Normal range of motion and neck supple.  Lymphadenopathy:     Cervical: No cervical adenopathy.  Skin:    General: Skin  is warm and dry.     Findings: Rash present. No erythema.     Comments: Multiple small lesions to the forearm - with small amouth of redness around each one - no abscesses - no draining lesions, no ttp, normal pulses  Neurological:     Mental Status: He is alert.     Coordination: Coordination normal.  Psychiatric:        Behavior: Behavior normal.    ED Results / Procedures / Treatments   Labs (all labs ordered are listed, but only abnormal results are displayed) Labs Reviewed - No data to display  EKG None  Radiology No results found.  Procedures Procedures   Medications Ordered in ED Medications - No data to display  ED Course  I have reviewed the triage vital signs and the nursing notes.  Pertinent labs & imaging results that were available during my care of the patient were reviewed by me and considered in my medical decision making (see chart for details).    MDM Rules/Calculators/A&P                            Rash, possible small staph infections VS with mild tachy - states it always happens around doctors / hospitals No signs of absdess Doxy and home, pt agreeable.  Final Clinical Impression(s) / ED Diagnoses Final diagnoses:  Rash    Rx / DC Orders ED Discharge Orders          Ordered    doxycycline (VIBRAMYCIN) 100 MG capsule  2 times daily        06/14/21 1605             Eber Hong, MD 06/14/21 [phone]

## 2021-06-14 NOTE — Discharge Instructions (Signed)
Doxycycline 100mg by mouth twice daily until the medicine is completely finished - this medicine is a strong antibiotic that treats certain infections.   ° °Thank you for letting us take care of you today! ° °Please obtain all of your results from medical records or have your doctors office obtain the results - share them with your doctor - you should be seen at your doctors office in the next 2 days. Call today to arrange your follow up. Take the medications as prescribed. Please review all of the medicines and only take them if you do not have an allergy to them. Please be aware that if you are taking birth control pills, taking other prescriptions, ESPECIALLY ANTIBIOTICS may make the birth control ineffective - if this is the case, either do not engage in sexual activity or use alternative methods of birth control such as condoms until you have finished the medicine and your family doctor says it is OK to restart them. If you are on a blood thinner such as COUMADIN, be aware that any other medicine that you take may cause the coumadin to either work too much, or not enough - you should have your coumadin level rechecked in next 7 days if this is the case.  °?  °It is also a possibility that you have an allergic reaction to any of the medicines that you have been prescribed - Everybody reacts differently to medications and while MOST people have no trouble with most medicines, you may have a reaction such as nausea, vomiting, rash, swelling, shortness of breath. If this is the case, please stop taking the medicine immediately and contact your physician.  ° °If you were given a medication in the ED such as percocet, vicodin, or morphine, be aware that these medicines are sedating and may change your ability to take care of yourself adequately for several hours after being given this medicines - you should not drive or take care of small children if you were given this medicine in the Emergency Department or if you  have been prescribed these types of medicines. °?  ° You should return to the ER IMMEDIATELY if you develop severe or worsening symptoms.  ° °

## 2021-06-14 NOTE — ED Triage Notes (Signed)
Multiple sores on right hand/forearm

## 2023-05-15 IMAGING — DX DG FOREARM 2V*R*
2 series · 2 of 2 positions shown · non-contrast
Comparison: None.

CLINICAL DATA: Swelling and redness to RIGHT forearm. Recent
splinter to forearm.

EXAM:
RIGHT FOREARM - 2 VIEW

[forearm ap]
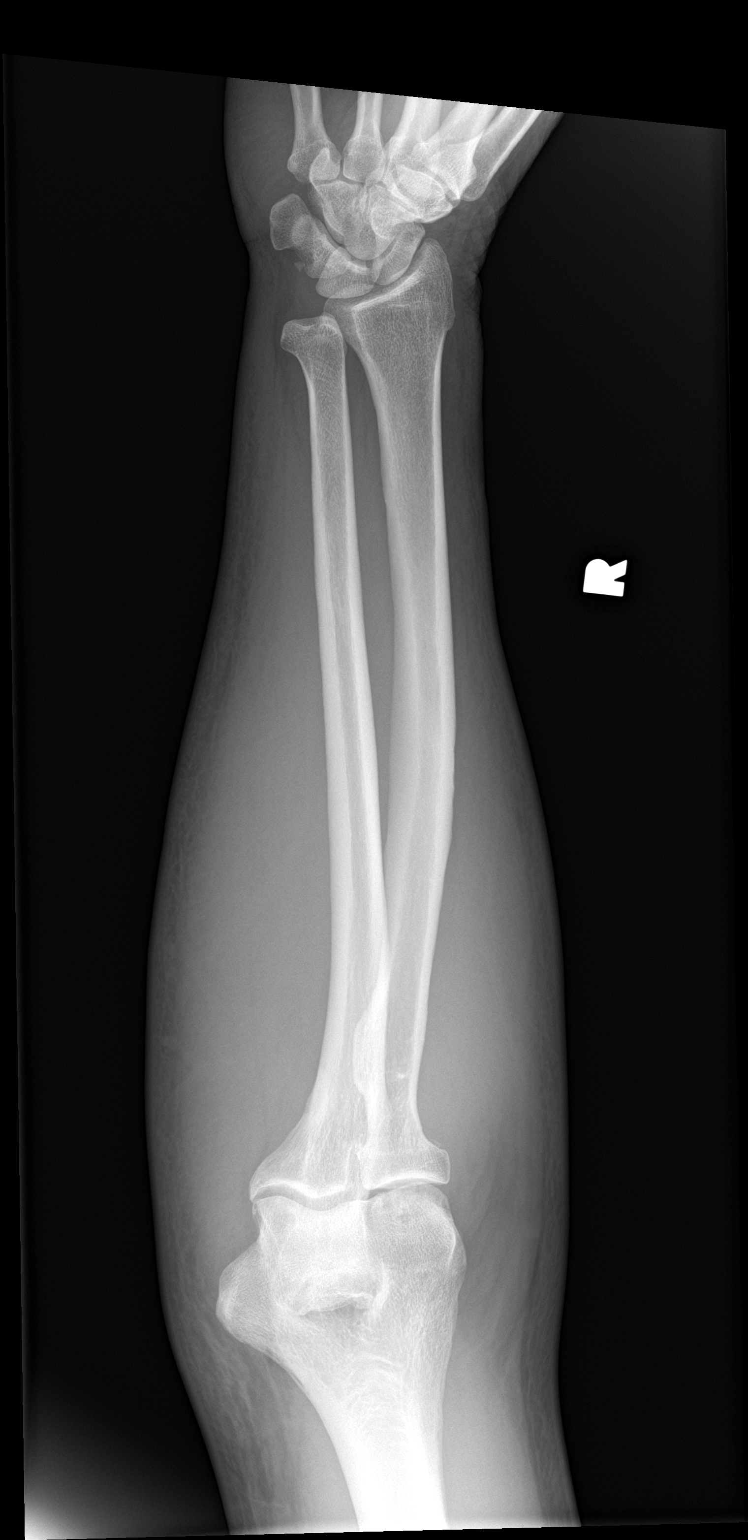

[forearm lat]
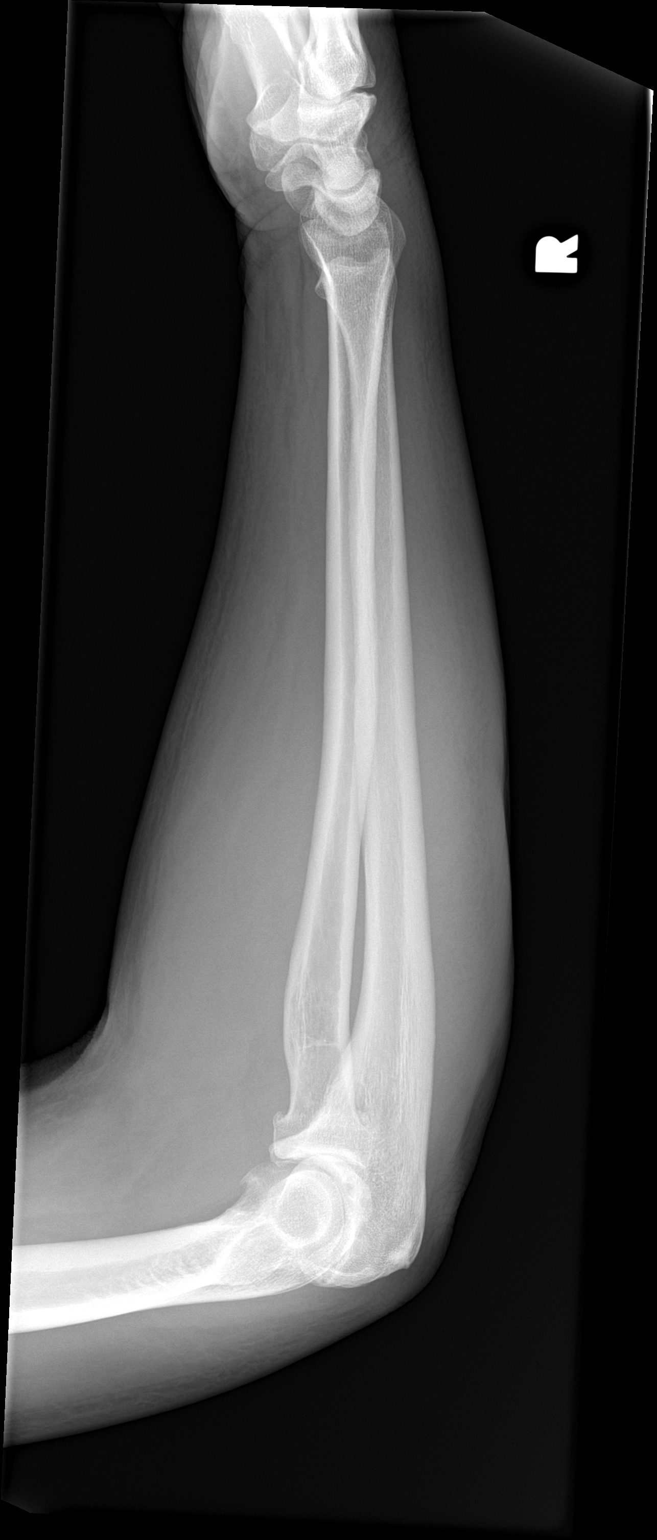

[2 of 2 positions shown; findings below may reference images not displayed]

FINDINGS: Osseous alignment is normal. Bone mineralization is normal. No
acute-appearing cortical irregularity or osseous lesion. Soft
tissues about the RIGHT forearm are unremarkable. No radiodense
foreign body seen. No soft tissue gas.
IMPRESSION: Negative. No osseous abnormality. No soft tissue gas or radiodense
foreign body seen.
